# Patient Record
Sex: Male | Born: 1990 | Race: White | Hispanic: No | Marital: Single | Smoking: Current every day smoker
Health system: Southern US, Community
[De-identification: ages and names within clinical notes are randomized; demographics above are authoritative.]

---
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
## 2020-05-14 ENCOUNTER — Emergency Department (HOSPITAL_BASED_OUTPATIENT_CLINIC_OR_DEPARTMENT_OTHER)
Admission: EM | Admit: 2020-05-14 | Discharge: 2020-05-14 | Disposition: A | Discharge status: Home / Self Care | Payer: Self-pay | Attending: Emergency Medicine | Admitting: Emergency Medicine

## 2020-05-14 ENCOUNTER — Encounter (HOSPITAL_BASED_OUTPATIENT_CLINIC_OR_DEPARTMENT_OTHER): Payer: Self-pay

## 2020-05-14 ENCOUNTER — Other Ambulatory Visit: Payer: Self-pay

## 2020-05-14 DIAGNOSIS — R1013 Epigastric pain: Secondary | ICD-10-CM

## 2020-05-14 DIAGNOSIS — R1011 Right upper quadrant pain: Secondary | ICD-10-CM | POA: Insufficient documentation

## 2020-05-14 DIAGNOSIS — K920 Hematemesis: Secondary | ICD-10-CM | POA: Insufficient documentation

## 2020-05-14 DIAGNOSIS — F17210 Nicotine dependence, cigarettes, uncomplicated: Secondary | ICD-10-CM | POA: Insufficient documentation

## 2020-05-14 LAB — CBC
HCT: 48.2 % (ref 39.0–52.0)
Hemoglobin: 17.1 g/dL — ABNORMAL HIGH (ref 13.0–17.0)
MCH: 30.8 pg (ref 26.0–34.0)
MCHC: 35.5 g/dL (ref 30.0–36.0)
MCV: 86.7 fL (ref 80.0–100.0)
Platelets: 267 10*3/uL (ref 150–400)
RBC: 5.56 MIL/uL (ref 4.22–5.81)
RDW: 12.3 % (ref 11.5–15.5)
WBC: 15 10*3/uL — ABNORMAL HIGH (ref 4.0–10.5)
nRBC: 0 % (ref 0.0–0.2)

## 2020-05-14 LAB — COMPREHENSIVE METABOLIC PANEL
ALT: 42 U/L (ref 0–44)
AST: 33 U/L (ref 15–41)
Albumin: 4.7 g/dL (ref 3.5–5.0)
Alkaline Phosphatase: 78 U/L (ref 38–126)
Anion gap: 12 (ref 5–15)
BUN: 18 mg/dL (ref 6–20)
CO2: 24 mmol/L (ref 22–32)
Calcium: 10.1 mg/dL (ref 8.9–10.3)
Chloride: 103 mmol/L (ref 98–111)
Creatinine, Ser: 1.13 mg/dL (ref 0.61–1.24)
GFR calc Af Amer: >60 mL/min (ref >60)
GFR calc non Af Amer: >60 mL/min (ref >60)
Glucose, Bld: 120 mg/dL — ABNORMAL HIGH (ref 70–99)
Potassium: 4.4 mmol/L (ref 3.5–5.1)
Sodium: 139 mmol/L (ref 135–145)
Total Bilirubin: 1 mg/dL (ref 0.3–1.2)
Total Protein: 7.6 g/dL (ref 6.5–8.1)

## 2020-05-14 LAB — LIPASE, BLOOD: Lipase: 25 U/L (ref 11–51)

## 2020-05-14 MED ORDER — FAMOTIDINE IN NACL 20-0.9 MG/50ML-% IV SOLN
20.0000 mg | Freq: Once | INTRAVENOUS | Status: AC
  Administered 2020-05-14: 20 mg via INTRAVENOUS
  Filled 2020-05-14: qty 50

## 2020-05-14 MED ORDER — SODIUM CHLORIDE 0.9% FLUSH
3.0000 mL | Freq: Once | INTRAVENOUS | Status: DC
  Filled 2020-05-14: qty 3

## 2020-05-14 MED ORDER — SODIUM CHLORIDE 0.9 % IV BOLUS
1000.0000 mL | Freq: Once | INTRAVENOUS | Status: AC
  Administered 2020-05-14: 1000 mL via INTRAVENOUS

## 2020-05-14 MED ORDER — ONDANSETRON HCL 4 MG/2ML IJ SOLN
4.0000 mg | Freq: Once | INTRAMUSCULAR | Status: AC
  Administered 2020-05-14: 4 mg via INTRAVENOUS
  Filled 2020-05-14: qty 2

## 2020-05-14 MED ORDER — SODIUM CHLORIDE 0.9 % IV SOLN
INTRAVENOUS | Status: DC | PRN
  Administered 2020-05-14: 250 mL via INTRAVENOUS

## 2020-05-14 NOTE — ED Triage Notes (Signed)
Pt c/o epigastric pain and vomited "bright red blood" x 1-NAD-to triage in w/c

## 2020-05-14 NOTE — ED Provider Notes (Signed)
[facility] EMERGENCY DEPARTMENT Provider Note   CSN: [id number] Arrival date & time: 05/14/20  1449     History Chief Complaint  Patient presents with  . Abdominal Pain    Patrick Wiggins is a 29 y.o. male.  He started having epigastric pain yesterday.  It continued into today and he vomited multiple times for which he noticed some red blood in it.  Pain was initially 8 out of 10 now 2 out of 10 here.  No melena.  He has multiple risk factors for peptic ulcer disease including Goody powders, alcohol, tobacco.  No prior abdominal surgeries.  He said he had an endoscopy 5+ years ago for abdominal pain and found that he was lactose intolerant.  The history is provided by the patient.  Abdominal Pain Pain location:  Epigastric Pain quality: cramping and stabbing   Pain radiates to:  Does not radiate Pain severity:  Severe Onset quality:  Gradual Timing:  Constant Progression:  Improving Chronicity:  New Context: alcohol use   Context: not recent travel, not sick contacts and not trauma   Relieved by:  None tried Worsened by:  Nothing Ineffective treatments:  None tried Associated symptoms: hematemesis, nausea and vomiting   Associated symptoms: no chest pain, no cough, no diarrhea, no dysuria, no fever, no hematochezia, no hematuria, no shortness of breath and no sore throat   Risk factors: NSAID use        History reviewed. No pertinent past medical history.  There are no problems to display for this patient.   History reviewed. No pertinent surgical history.     No family history on file.  Social History   Tobacco Use  . Smoking status: Current Every Day Smoker    Types: Cigarettes  . Smokeless tobacco: Never Used  Substance Use Topics  . Alcohol use: Yes    Comment: daily 3-4 beers  . Drug use: Yes    Types: Marijuana, Cocaine    Home Medications Prior to Admission medications   Not on File    Allergies    Patient has no known allergies.  Review of Systems   Review of Systems  Constitutional: Negative for fever.  HENT: Negative for sore throat.   Eyes: Negative for visual disturbance.  Respiratory: Negative for cough and shortness of breath.   Cardiovascular: Negative for chest pain.  Gastrointestinal: Positive for abdominal pain, hematemesis, nausea and vomiting. Negative for diarrhea and hematochezia.  Genitourinary: Negative for dysuria and hematuria.  Musculoskeletal: Negative for neck pain.  Skin: Negative for rash.  Neurological: Negative for light-headedness.    Physical Exam Updated Vital Signs BP (!) 156/115 (BP Location: Right Arm)   Pulse 65   Temp 97.9 F (36.6 C) (Oral)   Resp 16   Ht 6' (1.829 m)   Wt 83.9 kg   SpO2 100%   BMI 25.09 kg/m   Physical Exam Vitals and nursing note reviewed.  Constitutional:      Appearance: He is well-developed.  HENT:     Head: Normocephalic and atraumatic.  Eyes:     Conjunctiva/sclera: Conjunctivae normal.  Cardiovascular:     Rate and Rhythm: Normal rate and regular rhythm.     Heart sounds: No murmur.  Pulmonary:     Effort: Pulmonary effort is normal. No respiratory distress.     Breath sounds: Normal breath sounds.  Abdominal:     Palpations: Abdomen is soft.     Tenderness: There is no abdominal tenderness. There is  no guarding or rebound.  Musculoskeletal:        General: No deformity or signs of injury. Normal range of motion.     Cervical back: Neck supple.  Skin:    General: Skin is warm and dry.     Capillary Refill: Capillary refill takes less than 2 seconds.  Neurological:     General: No focal deficit present.     Mental Status: He is alert.     ED Results / Procedures / Treatments   Labs (all labs ordered are listed, but only abnormal results are displayed) Labs Reviewed  COMPREHENSIVE METABOLIC PANEL - Abnormal; Notable for the following components:      Result Value   Glucose, Bld 120 (*)    All other components within normal  limits  CBC - Abnormal; Notable for the following components:   WBC 15.0 (*)    Hemoglobin 17.1 (*)    All other components within normal limits  LIPASE, BLOOD    EKG None  Radiology No results found.  Procedures Procedures (including critical care time)  Medications Ordered in ED Medications  sodium chloride 0.9 % bolus 1,000 mL ( Intravenous Stopped 05/14/20 1933)  ondansetron (ZOFRAN) injection 4 mg (4 mg Intravenous Given 05/14/20 1833)  famotidine (PEPCID) IVPB 20 mg premix ( Intravenous Stopped 05/14/20 1915)    ED Course  I have reviewed the triage vital signs and the nursing notes.  Pertinent labs & imaging results that were available during my care of the patient were reviewed by me and considered in my medical decision making (see chart for details).  Clinical Course as of May 15 906  Wed May 14, 2020  1942 Patient's pain is resolved.  He is urinated here so I think he is adequately hydrated.  His hemoglobin is not low so I am less concerned that he is an active GI bleed.  Not tachycardic.  Likely related to some peptic ulcer disease or possibly a Mallory-Weiss.  Will have follow-up outpatient GI.  He said he is going to do some over-the-counter Prilosec.  Return instructions discussed   [MB]    Clinical Course User Index [MB] Terrilee Files, MD   MDM Rules/Calculators/A&P                     This patient complains of upper abdominal pain vomiting with blood; this involves an extensive number of treatment Options and is a complaint that carries with it a high risk of complications and Morbidity. The differential includes gastritis, peptic ulcer disease, Mallory-Weiss tear, variceal bleed  I ordered, reviewed and interpreted labs, which included CBC with elevated white count, question stress related versus infection, normal hemoglobin, normal chemistries other than elevated glucose, likely related to stress.  Normal BUN and creatinine so less likely to be  significant GI bleed. I ordered medication Pepcid fluids Zofran with improvement in his symptoms Previous records obtained and reviewed in epic  After the interventions stated above, I reevaluated the patient and found patient symptoms to be improved.  He said he used to be on a PPI but stopped taking them.  He does not want a prescription but states he will go back to using over-the-counter Prilosec.  We discussed other risk factor modification.  Will refer to GI.  Return instructions discussed.   Final Clinical Impression(s) / ED Diagnoses Final diagnoses:  Epigastric pain  Hematemesis with nausea    Rx / DC Orders ED Discharge Orders  Ordered    Ambulatory referral to Gastroenterology     05/14/20 1945           Hayden Rasmussen, MD 05/15/20 [phone]

## 2020-05-14 NOTE — Discharge Instructions (Signed)
You were seen in the emergency department for upper abdominal pain nausea vomiting with blood in the vomit.  You had blood work that did not show any serious findings.  Please try to limit using Goody powders and other anti-inflammatories.  Limit smoking and alcohol.  Start taking Prilosec.  Follow-up with GI.  If you have any worsening bleeding or pain return to the emergency department for further evaluation

## 2020-05-14 NOTE — ED Notes (Signed)
Pt will press call bell when he has a urine sample for [place].

## 2020-05-22 ENCOUNTER — Encounter: Payer: Self-pay | Admitting: Internal Medicine

## 2021-02-17 ENCOUNTER — Encounter (HOSPITAL_BASED_OUTPATIENT_CLINIC_OR_DEPARTMENT_OTHER): Payer: Self-pay

## 2021-02-17 ENCOUNTER — Emergency Department (HOSPITAL_BASED_OUTPATIENT_CLINIC_OR_DEPARTMENT_OTHER): Payer: Self-pay

## 2021-02-17 ENCOUNTER — Other Ambulatory Visit (HOSPITAL_COMMUNITY): Payer: Self-pay | Admitting: Emergency Medicine

## 2021-02-17 ENCOUNTER — Other Ambulatory Visit: Payer: Self-pay

## 2021-02-17 ENCOUNTER — Emergency Department (HOSPITAL_BASED_OUTPATIENT_CLINIC_OR_DEPARTMENT_OTHER)
Admission: EM | Admit: 2021-02-17 | Discharge: 2021-02-17 | Disposition: A | Discharge status: Home / Self Care | Payer: Self-pay | Attending: Emergency Medicine | Admitting: Emergency Medicine

## 2021-02-17 DIAGNOSIS — R1031 Right lower quadrant pain: Secondary | ICD-10-CM

## 2021-02-17 DIAGNOSIS — F1290 Cannabis use, unspecified, uncomplicated: Secondary | ICD-10-CM | POA: Insufficient documentation

## 2021-02-17 DIAGNOSIS — R110 Nausea: Secondary | ICD-10-CM | POA: Insufficient documentation

## 2021-02-17 DIAGNOSIS — F17210 Nicotine dependence, cigarettes, uncomplicated: Secondary | ICD-10-CM | POA: Insufficient documentation

## 2021-02-17 LAB — CBC WITH DIFFERENTIAL/PLATELET
Abs Immature Granulocytes: 0.02 10*3/uL (ref 0.00–0.07)
Basophils Absolute: 0 10*3/uL (ref 0.0–0.1)
Basophils Relative: 1 %
Eosinophils Absolute: 0.1 10*3/uL (ref 0.0–0.5)
Eosinophils Relative: 1 %
HCT: 47.8 % (ref 39.0–52.0)
Hemoglobin: 16.9 g/dL (ref 13.0–17.0)
Immature Granulocytes: 0 %
Lymphocytes Relative: 25 %
Lymphs Abs: 1.6 10*3/uL (ref 0.7–4.0)
MCH: 31.8 pg (ref 26.0–34.0)
MCHC: 35.4 g/dL (ref 30.0–36.0)
MCV: 90 fL (ref 80.0–100.0)
Monocytes Absolute: 0.5 10*3/uL (ref 0.1–1.0)
Monocytes Relative: 7 %
Neutro Abs: 4.2 10*3/uL (ref 1.7–7.7)
Neutrophils Relative %: 66 %
Platelets: 272 10*3/uL (ref 150–400)
RBC: 5.31 MIL/uL (ref 4.22–5.81)
RDW: 13 % (ref 11.5–15.5)
WBC: 6.4 10*3/uL (ref 4.0–10.5)
nRBC: 0 % (ref 0.0–0.2)

## 2021-02-17 LAB — URINALYSIS, ROUTINE W REFLEX MICROSCOPIC
Glucose, UA: NEGATIVE mg/dL
Ketones, ur: NEGATIVE mg/dL
Leukocytes,Ua: NEGATIVE
Nitrite: NEGATIVE
Protein, ur: NEGATIVE mg/dL
Specific Gravity, Urine: 1.01 (ref 1.005–1.030)
pH: 6 (ref 5.0–8.0)

## 2021-02-17 LAB — URINALYSIS, MICROSCOPIC (REFLEX)

## 2021-02-17 LAB — COMPREHENSIVE METABOLIC PANEL
ALT: 54 U/L — ABNORMAL HIGH (ref 0–44)
AST: 55 U/L — ABNORMAL HIGH (ref 15–41)
Albumin: 4.2 g/dL (ref 3.5–5.0)
Alkaline Phosphatase: 80 U/L (ref 38–126)
Anion gap: 11 (ref 5–15)
BUN: 5 mg/dL — ABNORMAL LOW (ref 6–20)
CO2: 23 mmol/L (ref 22–32)
Calcium: 9.1 mg/dL (ref 8.9–10.3)
Chloride: 103 mmol/L (ref 98–111)
Creatinine, Ser: 0.86 mg/dL (ref 0.61–1.24)
GFR, Estimated: >60 mL/min (ref >60)
Glucose, Bld: 103 mg/dL — ABNORMAL HIGH (ref 70–99)
Potassium: 3.9 mmol/L (ref 3.5–5.1)
Sodium: 137 mmol/L (ref 135–145)
Total Bilirubin: 0.7 mg/dL (ref 0.3–1.2)
Total Protein: 7.1 g/dL (ref 6.5–8.1)

## 2021-02-17 LAB — RAPID URINE DRUG SCREEN, HOSP PERFORMED
Amphetamines: NOT DETECTED
Barbiturates: NOT DETECTED
Benzodiazepines: NOT DETECTED
Cocaine: NOT DETECTED
Opiates: NOT DETECTED
Tetrahydrocannabinol: POSITIVE — AB

## 2021-02-17 LAB — LIPASE, BLOOD: Lipase: 43 U/L (ref 11–51)

## 2021-02-17 MED ORDER — ONDANSETRON 4 MG PO TBDP: 4.0000 mg | ORAL_TABLET | Freq: Three times a day (TID) | ORAL | 0 refills | Status: AC | PRN

## 2021-02-17 MED ORDER — KETOROLAC TROMETHAMINE 30 MG/ML IJ SOLN
30.0000 mg | Freq: Once | INTRAMUSCULAR | Status: AC
  Administered 2021-02-17: 30 mg via INTRAVENOUS
  Filled 2021-02-17: qty 1

## 2021-02-17 MED ORDER — IOHEXOL 300 MG/ML  SOLN
100.0000 mL | Freq: Once | INTRAMUSCULAR | Status: AC | PRN
  Administered 2021-02-17: 100 mL via INTRAVENOUS

## 2021-02-17 MED ORDER — PANTOPRAZOLE SODIUM 40 MG PO TBEC: 40.0000 mg | DELAYED_RELEASE_TABLET | Freq: Every day | ORAL | 0 refills | Status: AC

## 2021-02-17 MED ORDER — DICYCLOMINE HCL 20 MG PO TABS
20.0000 mg | ORAL_TABLET | Freq: Three times a day (TID) | ORAL | 0 refills | Status: AC | PRN
Start: 2021-02-17 — End: ?

## 2021-02-17 MED ORDER — ONDANSETRON HCL 4 MG/2ML IJ SOLN
4.0000 mg | Freq: Once | INTRAMUSCULAR | Status: AC
  Administered 2021-02-17: 4 mg via INTRAVENOUS
  Filled 2021-02-17: qty 2

## 2021-02-17 MED FILL — DICYCLOMINE 20 MG TABLET: 20 | 7 days supply | Qty: 20 | Fill #0

## 2021-02-17 MED FILL — ONDANSETRON ODT 4 MG TABLET: 4 | 6 days supply | Qty: 20 | Fill #0

## 2021-02-17 MED FILL — PANTOPRAZOLE SOD DR 40 MG T: 40 | 30 days supply | Qty: 30 | Fill #0

## 2021-02-17 NOTE — ED Provider Notes (Signed)
Emergency Department Provider Note   I have reviewed the triage vital signs and the nursing notes.   HISTORY  Chief Complaint Abdominal Pain   HPI Patrick Wiggins is a 30 y.o. male with past medical history of chronic abdominal pain as a child presents to the emergency department with return of right lower quadrant abdominal pain.  Patient states that for approximately 11 years as a child he had intermittent, severe right lower quadrant abdominal pain.  He states that as an adult he stopped drinking milk and the pain went away.  He has not had pain in this area since 2015 after undergoing the dietary change.  He notes pain returning in a severe way over the past 24 to 36 hours.  He has some nausea but no vomiting or diarrhea.  No dysuria, hesitancy, urgency.  No radiation of pain up into the chest, shortness of breath, cough. He continues to abstain from dairy.    History reviewed. No pertinent past medical history.  There are no problems to display for this patient.   History reviewed. No pertinent surgical history.  Allergies Patient has no known allergies.  No family history on file.  Social History Social History   Tobacco Use  . Smoking status: Current Every Day Smoker    Packs/day: 1.00    Types: Cigarettes  . Smokeless tobacco: Never Used  Vaping Use  . Vaping Use: Never used  Substance Use Topics  . Alcohol use: Yes    Comment: daily 3-4 beers  . Drug use: Yes    Types: Marijuana, Cocaine    Comment: mushrooms     Review of Systems  Constitutional: No fever/chills Eyes: No visual changes. ENT: No sore throat. Cardiovascular: Denies chest pain. Respiratory: Denies shortness of breath. Gastrointestinal: Positive RLQ abdominal pain. Positive nausea, no vomiting.  No diarrhea.  No constipation. Genitourinary: Negative for dysuria. Musculoskeletal: Negative for back pain. Skin: Negative for rash. Neurological: Negative for focal weakness or numbness.  Positive HA.   10-point ROS otherwise negative.  ____________________________________________   PHYSICAL EXAM:  VITAL SIGNS: ED Triage Vitals  Enc Vitals Group     BP 02/17/21 0929 (!) 145/99     Pulse Rate 02/17/21 0929 80     Resp 02/17/21 0929 18     Temp 02/17/21 0929 98 F (36.7 C)     Temp Source 02/17/21 0929 Oral     SpO2 02/17/21 0929 98 %     Weight 02/17/21 0930 185 lb (83.9 kg)     Height 02/17/21 0930 6' (1.829 m)   Constitutional: Alert and oriented. Well appearing and in no acute distress. Eyes: Conjunctivae are normal.  Head: Atraumatic. Nose: No congestion/rhinnorhea. Mouth/Throat: Mucous membranes are moist.   Neck: No stridor.   Cardiovascular: Normal rate, regular rhythm. Good peripheral circulation. Grossly normal heart sounds.   Respiratory: Normal respiratory effort.  No retractions. Lungs CTAB. Gastrointestinal: Soft with mild tenderness to deep palpation in the RLQ. No rebound or guarding. No distention.  Musculoskeletal: No gross deformities of extremities. Neurologic:  Normal speech and language.  Skin:  Skin is warm, dry and intact. No rash noted.   ____________________________________________   LABS (all labs ordered are listed, but only abnormal results are displayed)  Labs Reviewed  COMPREHENSIVE METABOLIC PANEL - Abnormal; Notable for the following components:      Result Value   Glucose, Bld 103 (*)    BUN 5 (*)    AST 55 (*)  ALT 54 (*)    All other components within normal limits  URINALYSIS, ROUTINE W REFLEX MICROSCOPIC - Abnormal; Notable for the following components:   Hgb urine dipstick TRACE (*)    Bilirubin Urine MODERATE (*)    All other components within normal limits  RAPID URINE DRUG SCREEN, HOSP PERFORMED - Abnormal; Notable for the following components:   Tetrahydrocannabinol POSITIVE (*)    All other components within normal limits  URINALYSIS, MICROSCOPIC (REFLEX) - Abnormal; Notable for the following  components:   Bacteria, UA RARE (*)    All other components within normal limits  LIPASE, BLOOD  CBC WITH DIFFERENTIAL/PLATELET    ____________________________________________  RADIOLOGY  CT ABDOMEN PELVIS W CONTRAST  Result Date: 02/17/2021 CLINICAL DATA:  Right lower quadrant pain EXAM: CT ABDOMEN AND PELVIS WITH CONTRAST TECHNIQUE: Multidetector CT imaging of the abdomen and pelvis was performed using the standard protocol following bolus administration of intravenous contrast. CONTRAST:  OMNIPAQUE IOHEXOL 300 MG/ML  SOLN COMPARISON:  None. FINDINGS: Lower chest: No acute abnormality. Hepatobiliary: No focal liver abnormality is seen. No gallstones, gallbladder wall thickening, or biliary dilatation. Pancreas: Unremarkable. Spleen: Unremarkable. Adrenals/Urinary Tract: Adrenals, kidneys, and poorly distended bladder are unremarkable. Stomach/Bowel: Stomach is within normal limits. Bowel is normal in caliber. Normal appendix. Mild diffuse colonic wall thickening. Terminal ileum is poorly distended. Vascular/Lymphatic: No significant vascular abnormality. No enlarged lymph nodes. Reproductive: Unremarkable. Other: No ascites.  Abdominal wall is unremarkable. Musculoskeletal: No acute osseous abnormality. IMPRESSION: Normal appendix. Mild diffuse colonic wall thickening, which may be on the basis of underdistention. No surrounding inflammatory changes to suggest acute process. Correlate with colonoscopy findings. Electronically Signed   By: Guadlupe Spanish M.D.   On: 02/17/2021 10:31    ____________________________________________   PROCEDURES  Procedure(s) performed:   Procedures  None  ____________________________________________   INITIAL IMPRESSION / ASSESSMENT AND PLAN / ED COURSE  Pertinent labs & imaging results that were available during my care of the patient were reviewed by me and considered in my medical decision making (see chart for details).   Patient presents  emergency department with return of right lower quadrant abdominal pain.  This is a chronic pain for him as a child that is resolved many years ago and has just recently returned.  He has mild tenderness in the right lower quadrant but no rebound or guarding.  He has no prior abdominal surgery history.  This may be his chronic pain returning but after many years of not having pain I do feel that work-up is indicated.  Plan for labs, UA, CT abdomen pelvis.  In review of epic I do not see any recent CT scans.  Patient states that his last one was he thinks around 83 or maybe before.   CT abdomen pelvis reviewed with no acute findings.  Appendix is normal.  Mild inflammation through the colon noted.  Patient reports a history of colonoscopy but nothing recent.  Will provide contact info for PCP as well as GI for continued follow-up.  Plan for symptom management at home.  Discussed ED return precautions. ____________________________________________  FINAL CLINICAL IMPRESSION(S) / ED DIAGNOSES  Final diagnoses:  Right lower quadrant abdominal pain     MEDICATIONS GIVEN DURING THIS VISIT:  Medications  ketorolac (TORADOL) 30 MG/ML injection 30 mg (30 mg Intravenous Given 02/17/21 0958)  ondansetron (ZOFRAN) injection 4 mg (4 mg Intravenous Given 02/17/21 0957)  iohexol (OMNIPAQUE) 300 MG/ML solution 100 mL (100 mLs Intravenous Contrast Given 02/17/21 1009)  NEW OUTPATIENT MEDICATIONS STARTED DURING THIS VISIT:  New Prescriptions   DICYCLOMINE (BENTYL) 20 MG TABLET    Take 1 tablet (20 mg total) by mouth 3 (three) times daily as needed.   ONDANSETRON (ZOFRAN ODT) 4 MG DISINTEGRATING TABLET    Take 1 tablet (4 mg total) by mouth every 8 (eight) hours as needed.   PANTOPRAZOLE (PROTONIX) 40 MG TABLET    Take 1 tablet (40 mg total) by mouth daily.    Note:  This document was prepared using Dragon voice recognition software and may include unintentional dictation errors.  Alona Bene, MD,  [facility]    Tanisa Lagace, Arlyss Repress, MD 02/17/21 [phone]

## 2021-02-17 NOTE — Discharge Instructions (Signed)

## 2021-02-17 NOTE — ED Triage Notes (Signed)
Pt reports chronic pain to RLQ, has had EGD's and colonoscopies. Reports he stopped drinking milk and it got better but over the past few days the pain has come back, denies drinking any milk or other lactose. Pt reports pain is worse when standing and worse in the morning. Pt reports some nausea from pain, no vomiting or diarrhea.

## 2021-07-20 ENCOUNTER — Other Ambulatory Visit (HOSPITAL_BASED_OUTPATIENT_CLINIC_OR_DEPARTMENT_OTHER): Payer: Self-pay

## 2021-07-20 ENCOUNTER — Emergency Department (HOSPITAL_COMMUNITY)
Admission: EM | Admit: 2021-07-20 | Discharge: 2021-07-20 | Disposition: A | Discharge status: Home / Self Care | Payer: Self-pay | Attending: Emergency Medicine | Admitting: Emergency Medicine

## 2021-07-20 ENCOUNTER — Other Ambulatory Visit: Payer: Self-pay

## 2021-07-20 ENCOUNTER — Encounter (HOSPITAL_COMMUNITY): Payer: Self-pay | Admitting: Emergency Medicine

## 2021-07-20 ENCOUNTER — Emergency Department (HOSPITAL_COMMUNITY): Payer: Self-pay

## 2021-07-20 DIAGNOSIS — M25561 Pain in right knee: Secondary | ICD-10-CM | POA: Insufficient documentation

## 2021-07-20 DIAGNOSIS — F17210 Nicotine dependence, cigarettes, uncomplicated: Secondary | ICD-10-CM | POA: Insufficient documentation

## 2021-07-20 MED ORDER — DOXYCYCLINE HYCLATE 100 MG PO CAPS
100.0000 mg | ORAL_CAPSULE | Freq: Two times a day (BID) | ORAL | 0 refills | Status: AC
  Filled 2021-07-20: qty 14, 7d supply, fill #0

## 2021-07-20 MED ORDER — KETOROLAC TROMETHAMINE 15 MG/ML IJ SOLN
15.0000 mg | Freq: Once | INTRAMUSCULAR | Status: AC
  Administered 2021-07-20: 15 mg via INTRAMUSCULAR
  Filled 2021-07-20: qty 1

## 2021-07-20 MED ORDER — NAPROXEN 500 MG PO TABS
500.0000 mg | ORAL_TABLET | Freq: Two times a day (BID) | ORAL | 0 refills | Status: AC
Start: 2021-07-20 — End: 2021-07-27
  Filled 2021-07-20: qty 14, 7d supply, fill #0

## 2021-07-20 NOTE — ED Provider Notes (Signed)
[facility]-EMERGENCY DEPT Provider Note   CSN: [id number] Arrival date & time: 07/20/21  1137     History Chief Complaint  Patient presents with   Knee Pain    Patrick Wiggins is a 30 y.o. male.  30 y.o male with no PMH presents to the ED with a chief complaint of right knee pain x today. Patient reports waking up this morning, feeling like "somebody took a sledgehammer to my right knee ".  Reports he been icing the area but without much improvement in symptoms.  In addition, patient reports he "popped an ingrown hair ", on his right knee about 2 days ago, reports cleaning the area with alcohol, pain began two days after.  No fever, no history of IV drug use, no trauma.   The history is provided by the patient.  Knee Pain Location:  Knee Time since incident:  1 day Injury: no   Knee location:  R knee Pain details:    Quality:  Sharp and throbbing   Radiates to:  Does not radiate   Severity:  Moderate   Onset quality:  Sudden   Duration:  1 day   Timing:  Constant   Progression:  Worsening Chronicity:  New Dislocation: no   Foreign body present:  No foreign bodies Tetanus status:  Unknown Relieved by:  Ice Worsened by:  Extension Associated symptoms: no fever         Social History   Tobacco Use   Smoking status: Every Day    Packs/day: 1.00    Types: Cigarettes   Smokeless tobacco: Never  Vaping Use   Vaping Use: Never used  Substance Use Topics   Alcohol use: Yes    Comment: daily 3-4 beers   Drug use: Yes    Types: Marijuana, Cocaine    Comment: mushrooms     Home Medications Prior to Admission medications   Medication Sig Start Date End Date Taking? Authorizing Provider  doxycycline (VIBRAMYCIN) 100 MG capsule Take 1 capsule (100 mg total) by mouth 2 (two) times daily for 7 days. 07/20/21 07/27/21 Yes Jerome Viglione, PA-C  naproxen (NAPROSYN) 500 MG tablet Take 1 tablet (500 mg total) by mouth 2 (two) times daily for 7 days. 07/20/21  07/27/21 Yes Sheera Illingworth, Leonie Douglas, PA-C  dicyclomine (BENTYL) 20 MG tablet Take 1 tablet (20 mg total) by mouth 3 (three) times daily as needed. 02/17/21   Long, Arlyss Repress, MD  dicyclomine (BENTYL) 20 MG tablet TAKE 1 TABLET (20 MG TOTAL) BY MOUTH 3 (THREE) TIMES DAILY AS NEEDED. 02/17/21 02/17/22  Long, Arlyss Repress, MD  ondansetron (ZOFRAN ODT) 4 MG disintegrating tablet Take 1 tablet (4 mg total) by mouth every 8 (eight) hours as needed. 02/17/21   Long, Arlyss Repress, MD  ondansetron (ZOFRAN-ODT) 4 MG disintegrating tablet TAKE 1 TABLET (4 MG TOTAL) BY MOUTH EVERY 8 (EIGHT) HOURS AS NEEDED. 02/17/21 02/17/22  Long, Arlyss Repress, MD  pantoprazole (PROTONIX) 40 MG tablet Take 1 tablet (40 mg total) by mouth daily. 02/17/21   Long, Arlyss Repress, MD  pantoprazole (PROTONIX) 40 MG tablet TAKE 1 TABLET (40 MG TOTAL) BY MOUTH DAILY. 02/17/21 02/17/22  Long, Arlyss Repress, MD    Allergies    Patient has no known allergies.  Review of Systems   Review of Systems  Constitutional:  Negative for chills and fever.  Musculoskeletal:  Positive for arthralgias.   Physical Exam Updated Vital Signs BP (!) 145/95   Pulse (!) 102   Temp  98.6 F (37 C) (Oral)   Resp 16   Ht 6' (1.829 m)   Wt 88.5 kg   SpO2 98%   BMI 26.45 kg/m   Physical Exam Vitals and nursing note reviewed.  Constitutional:      Appearance: Normal appearance.  HENT:     Head: Normocephalic and atraumatic.  Eyes:     Pupils: Pupils are equal, round, and reactive to light.  Cardiovascular:     Rate and Rhythm: Normal rate.  Pulmonary:     Effort: Pulmonary effort is normal.  Abdominal:     General: Abdomen is flat.  Musculoskeletal:        General: Swelling and tenderness present.     Cervical back: Normal range of motion and neck supple.     Right knee: Swelling, effusion and bony tenderness present. Normal range of motion. Tenderness present.       Legs:     Comments: Swelling noted to the right knee, with surrounding erythema, some streaking in the skin.   Full range of motion of the right knee with pain.  Skin:    General: Skin is warm and dry.  Neurological:     Mental Status: He is alert and oriented to person, place, and time.    ED Results / Procedures / Treatments   Labs (all labs ordered are listed, but only abnormal results are displayed) Labs Reviewed - No data to display  EKG None  Radiology DG Knee 2 Views Right  Result Date: 07/20/2021 CLINICAL DATA:  Pain EXAM: RIGHT KNEE - 1-2 VIEW COMPARISON:  None. FINDINGS: No evidence of acute fracture. No joint effusion. No significant degenerative change. There is prepatellar soft tissue swelling. IMPRESSION: Prepatellar soft tissue swelling, possibly bursitis. No acute fracture or significant joint effusion. Electronically Signed   By: Caprice Renshaw   On: 07/20/2021 14:09    Procedures Procedures   Medications Ordered in ED Medications  ketorolac (TORADOL) 15 MG/ML injection 15 mg (has no administration in time range)    ED Course  I have reviewed the triage vital signs and the nursing notes.  Pertinent labs & imaging results that were available during my care of the patient were reviewed by me and considered in my medical decision making (see chart for details).    MDM Rules/Calculators/A&P  Patient arrives with right knee pain.  No trauma, no IV drug use, no fever, no chills.  No prior history of gout, does endorse beer intake.  Did pop a ingrown hair to his right knee 2 days ago, right knee appears erythematous, with a mild effusion noted but has full range of motion of the knee joint.  Has placed ice along with taking a Goody powder with some relief in his symptoms.  Xray of the right knee showed:  Prepatellar soft tissue swelling, possibly bursitis.     No acute fracture or significant joint effusion.      These results were discussed at length with patient, I personally marked his right knee with a skin marker, he was advised if redness worsens he will need to  return to the emergency department sooner than the 2 days.  Some suspicion for septic bursitis, no involvement in the joint as he is able to range his joint fully without any pain.  Does describe some chills but was afebrile in evaluation.  Will be placed on doxycycline a 7-day course, also given anti-inflammatories to help with pain.  Return precautions discussed at length, patient stable  for discharge.   Portions of this note were generated with Scientist, clinical (histocompatibility and immunogenetics). Dictation errors may occur despite best attempts at proofreading.  Final Clinical Impression(s) / ED Diagnoses Final diagnoses:  Acute pain of right knee    Rx / DC Orders ED Discharge Orders          Ordered    doxycycline (VIBRAMYCIN) 100 MG capsule  2 times daily        07/20/21 1447    naproxen (NAPROSYN) 500 MG tablet  2 times daily        07/20/21 1448             Claude Manges, PA-C 07/20/21 1452    Derwood Kaplan, MD 07/20/21 1528

## 2021-07-20 NOTE — Discharge Instructions (Addendum)
I have prescribed medication to help with the swelling, please take 1 tablet twice a day with food for the next 7 days.  In addition, you were placed on doxycycline, please take 1 tablet twice a day for the next 7 days as well.  We discussed worsening symptoms such as unable to move your right knee, worsening redness, fever or chills you will need to return to the emergency department immediately.  You will also need to return for a wound check in the next 2 days.

## 2021-07-20 NOTE — ED Triage Notes (Addendum)
Yesterday while pressing he gas pedal, the patient noticed soreness in the right knee. Pain worsened in the AM today and swelling was noted. Pain increases with activity and he is unable to bare weight on that knee.

## 2021-07-21 ENCOUNTER — Emergency Department (HOSPITAL_BASED_OUTPATIENT_CLINIC_OR_DEPARTMENT_OTHER)
Admission: EM | Admit: 2021-07-21 | Discharge: 2021-07-21 | Disposition: A | Discharge status: Home / Self Care | Payer: Self-pay | Attending: Emergency Medicine | Admitting: Emergency Medicine

## 2021-07-21 ENCOUNTER — Other Ambulatory Visit: Payer: Self-pay

## 2021-07-21 ENCOUNTER — Encounter (HOSPITAL_BASED_OUTPATIENT_CLINIC_OR_DEPARTMENT_OTHER): Payer: Self-pay | Admitting: *Deleted

## 2021-07-21 DIAGNOSIS — L03115 Cellulitis of right lower limb: Secondary | ICD-10-CM | POA: Insufficient documentation

## 2021-07-21 DIAGNOSIS — F17210 Nicotine dependence, cigarettes, uncomplicated: Secondary | ICD-10-CM | POA: Insufficient documentation

## 2021-07-21 DIAGNOSIS — J069 Acute upper respiratory infection, unspecified: Secondary | ICD-10-CM | POA: Insufficient documentation

## 2021-07-21 DIAGNOSIS — Z20822 Contact with and (suspected) exposure to covid-19: Secondary | ICD-10-CM | POA: Insufficient documentation

## 2021-07-21 DIAGNOSIS — B9789 Other viral agents as the cause of diseases classified elsewhere: Secondary | ICD-10-CM

## 2021-07-21 DIAGNOSIS — J988 Other specified respiratory disorders: Secondary | ICD-10-CM

## 2021-07-21 NOTE — ED Triage Notes (Signed)
Right knee swelling and redness. He was seen at [facility] yesterday for same and started on antibiotics for possible infection in his bursa.

## 2021-07-21 NOTE — ED Provider Notes (Signed)
[facility] EMERGENCY DEPARTMENT Provider Note   CSN: [id number] Arrival date & time: 07/21/21  1949     History Chief Complaint  Patient presents with   Cellulitis    Patrick Wiggins is a 30 y.o. male.  Patrick Wiggins is a 30 yo male who presents today for a follow-up on right knee bursitis, evaluated yesterday at [facility]. He states that on Sunday night he woke up with a horrible, 10/10 pain in his left knee. He said he could not walk and called out of work. He denies any recent fall, but does admit to recently popping a whitehead in the area with his fingernails which caused a small wound. He was evaluated at [facility] and imaging showed bursitis. He was discharged with naproxen and doxycycline, and has had two doses since. He stated that he can walk on it now and the pain has improved, but the area of erythema has expanded, and he was instructed to return for evaluation for this reason. In addition, he states he has been feeling "icky" today, and admits to inner ear pain on bilateral ears as well as a sore throat. He denies fever and chills.       History reviewed. No pertinent past medical history.  There are no problems to display for this patient.   History reviewed. No pertinent surgical history.     No family history on file.  Social History   Tobacco Use   Smoking status: Every Day    Packs/day: 1.00    Types: Cigarettes   Smokeless tobacco: Never  Vaping Use   Vaping Use: Some days  Substance Use Topics   Alcohol use: Yes    Comment: daily 3-4 beers   Drug use: Yes    Types: Marijuana, Cocaine    Comment: mushrooms     Home Medications Prior to Admission medications   Medication Sig Start Date End Date Taking? Authorizing Provider  dicyclomine (BENTYL) 20 MG tablet Take 1 tablet (20 mg total) by mouth 3 (three) times daily as needed. 02/17/21   Long, Arlyss Repress, MD  dicyclomine (BENTYL) 20 MG tablet TAKE 1 TABLET (20 MG TOTAL) BY MOUTH 3 (THREE) TIMES DAILY  AS NEEDED. 02/17/21 02/17/22  Long, Arlyss Repress, MD  doxycycline (VIBRAMYCIN) 100 MG capsule Take 1 capsule (100 mg total) by mouth 2 (two) times daily for 7 days. 07/20/21 07/27/21  Claude Manges, PA-C  naproxen (NAPROSYN) 500 MG tablet Take 1 tablet (500 mg total) by mouth 2 (two) times daily for 7 days. 07/20/21 07/27/21  Claude Manges, PA-C  ondansetron (ZOFRAN ODT) 4 MG disintegrating tablet Take 1 tablet (4 mg total) by mouth every 8 (eight) hours as needed. 02/17/21   Long, Arlyss Repress, MD  ondansetron (ZOFRAN-ODT) 4 MG disintegrating tablet TAKE 1 TABLET (4 MG TOTAL) BY MOUTH EVERY 8 (EIGHT) HOURS AS NEEDED. 02/17/21 02/17/22  Long, Arlyss Repress, MD  pantoprazole (PROTONIX) 40 MG tablet Take 1 tablet (40 mg total) by mouth daily. 02/17/21   Long, Arlyss Repress, MD  pantoprazole (PROTONIX) 40 MG tablet TAKE 1 TABLET (40 MG TOTAL) BY MOUTH DAILY. 02/17/21 02/17/22  Long, Arlyss Repress, MD    Allergies    Patient has no known allergies.  Review of Systems   Review of Systems  Constitutional:  Negative for fever.  HENT:  Positive for ear pain and sore throat.   Respiratory:  Negative for cough.   Musculoskeletal:  Positive for joint swelling. Negative for arthralgias.  Skin:  Positive  for color change and wound.  Allergic/Immunologic: Negative for immunocompromised state.  Neurological:  Negative for weakness and numbness.  Hematological:  Negative for adenopathy.  All other systems reviewed and are negative.  Physical Exam Updated Vital Signs BP (!) 142/90   Pulse 90   Temp 98.3 F (36.8 C) (Oral)   Resp 20   Ht 6' (1.829 m)   Wt 88.5 kg   SpO2 99%   BMI 26.46 kg/m   Physical Exam Vitals and nursing note reviewed.  Constitutional:      General: He is not in acute distress.    Appearance: He is well-developed. He is not diaphoretic.  HENT:     Head: Normocephalic and atraumatic.     Right Ear: Tympanic membrane and ear canal normal.     Left Ear: Tympanic membrane and ear canal normal.      Mouth/Throat:     Mouth: Mucous membranes are moist.     Pharynx: Posterior oropharyngeal erythema present. No oropharyngeal exudate.  Cardiovascular:     Rate and Rhythm: Normal rate and regular rhythm.     Pulses: Normal pulses.     Heart sounds: Normal heart sounds.  Pulmonary:     Effort: Pulmonary effort is normal.     Breath sounds: Normal breath sounds.  Musculoskeletal:        General: Swelling present. No tenderness or deformity.     Cervical back: Neck supple.     Comments: Able to fully flex and extend the right knee without pain.  There is erythema to the anterior right knee, no significant tenderness.  Lymphadenopathy:     Cervical: No cervical adenopathy.  Skin:    General: Skin is warm and dry.     Findings: Erythema present.  Neurological:     Mental Status: He is alert and oriented to person, place, and time.  Psychiatric:        Behavior: Behavior normal.       ED Results / Procedures / Treatments   Labs (all labs ordered are listed, but only abnormal results are displayed) Labs Reviewed  SARS CORONAVIRUS 2 (TAT 6-24 HRS)    EKG None  Radiology DG Knee 2 Views Right  Result Date: 07/20/2021 CLINICAL DATA:  Pain EXAM: RIGHT KNEE - 1-2 VIEW COMPARISON:  None. FINDINGS: No evidence of acute fracture. No joint effusion. No significant degenerative change. There is prepatellar soft tissue swelling. IMPRESSION: Prepatellar soft tissue swelling, possibly bursitis. No acute fracture or significant joint effusion. Electronically Signed   By: Caprice Renshaw   On: 07/20/2021 14:09    Procedures Procedures   Medications Ordered in ED Medications - No data to display  ED Course  I have reviewed the triage vital signs and the nursing notes.  Pertinent labs & imaging results that were available during my care of the patient were reviewed by me and considered in my medical decision making (see chart for details).  Clinical Course as of 07/21/21 2334  Tue Jul 21, 4064  674 30 year old male with spreading of redness to the right knee.  Patient seen yesterday for same, started on doxycycline, has taken 2 doses.  Exam is reassuring, he is able to fully flex and extend the knee, able to bear weight without pain and feels as if the knee is improving overall despite increase in redness as marked and photographed for chart.  As patient is only taken 2 doses of his doxycycline, recommend he continue to take the antibiotics  and monitor the area.  In regards to his mild URI symptoms, we will send COVID test today.  He feels like this is likely not COVID as he has had COVID 3 times previously with the last infection being October 2021.  He will follow-up in his MyChart account for his test results. [LM]    Clinical Course User Index [LM] Alden Hipp   MDM Rules/Calculators/A&P                           Final Clinical Impression(s) / ED Diagnoses Final diagnoses:  Viral respiratory illness  Cellulitis of right knee    Rx / DC Orders ED Discharge Orders     None        Jeannie Fend, PA-C 07/21/21 2335    Maia Plan, MD 07/27/21 1303

## 2021-07-21 NOTE — Discharge Instructions (Addendum)
Home to quarantine pending your COVID test results which will be available in your MyChart account in the next 6 to 24 hours. Apply warm compresses to your knee for 20 minutes at a time.  Continue taking your antibiotics.

## 2021-07-22 LAB — SARS CORONAVIRUS 2 (TAT 6-24 HRS): SARS Coronavirus 2: NEGATIVE

## 2022-07-30 IMAGING — CT CT ABD-PELV W/ CM
2 of 4 series · 16 of 46 positions shown, 18 images · IV contrast (Omnipaque)
Comparison: None.

CLINICAL DATA: Right lower quadrant pain

EXAM:
CT ABDOMEN AND PELVIS WITH CONTRAST
TECHNIQUE: Multidetector CT imaging of the abdomen and pelvis was performed
using the standard protocol following bolus administration of
intravenous contrast.
CONTRAST:  100mL OMNIPAQUE IOHEXOL 300 MG/ML  SOLN

[Series 2: axial st · axial · 0.85mm/px · z∈[-696,-152]mm · 13 of 119 slices shown, 15 images]
[im 5/119  soft-tissue]
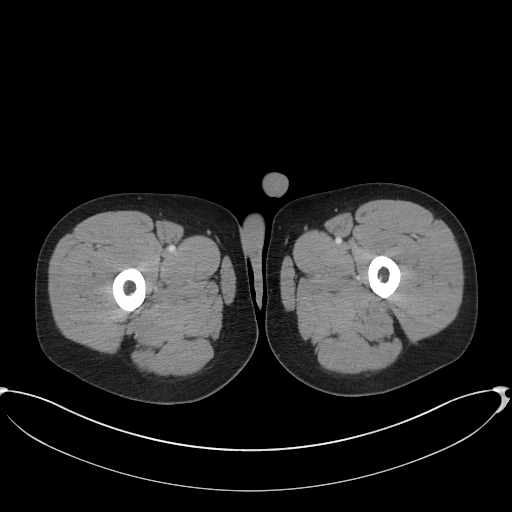
[im 5/119  bone]
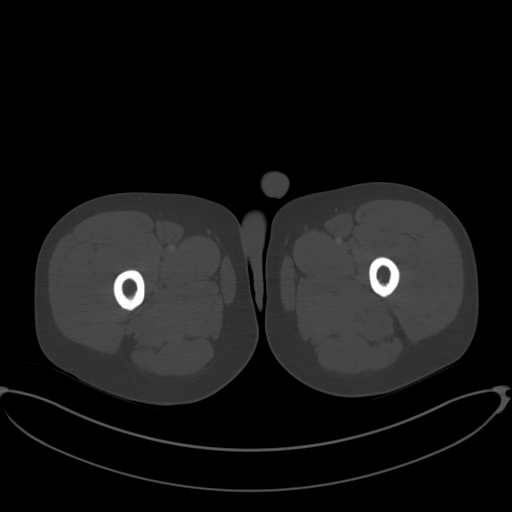
[im 15/119  soft-tissue]
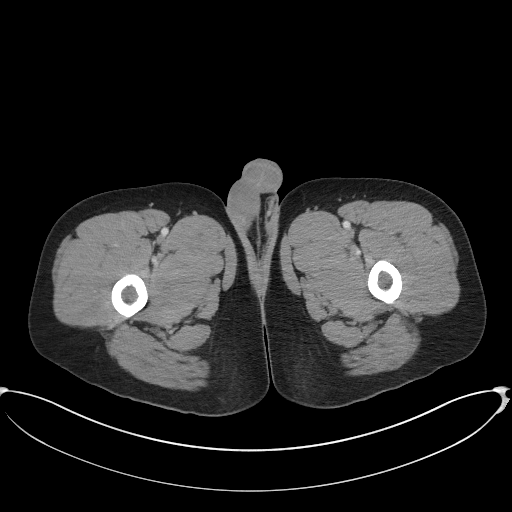
[im 25/119  soft-tissue]
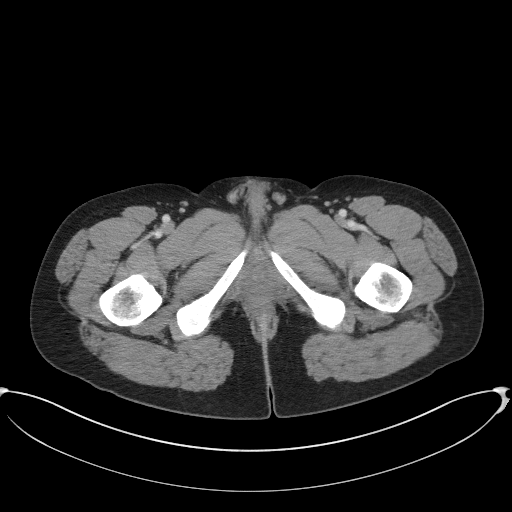
[im 35/119  soft-tissue]
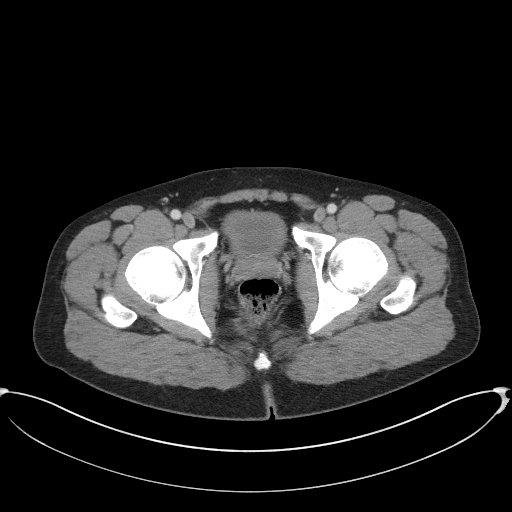
[im 40/119  soft-tissue]
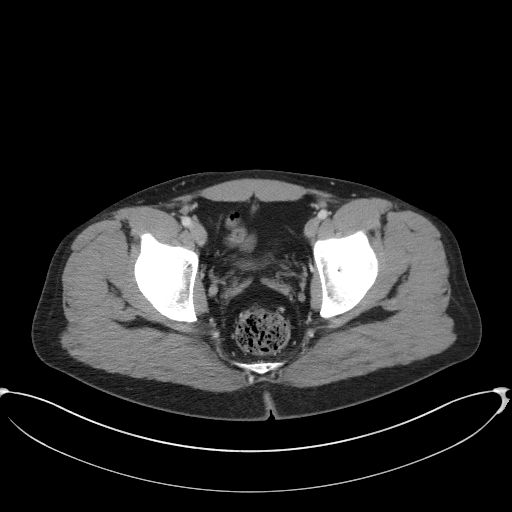
[im 50/119  soft-tissue]
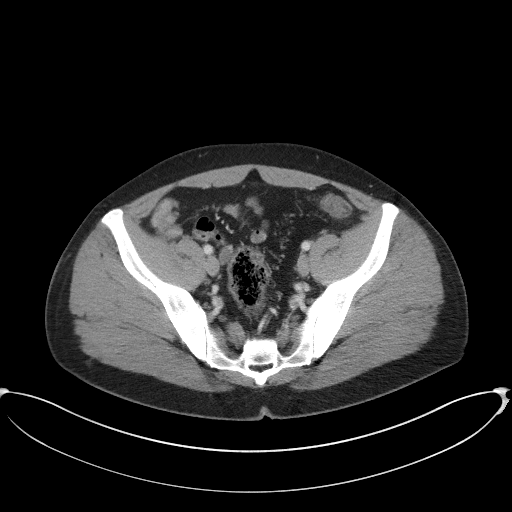
[im 60/119  soft-tissue]
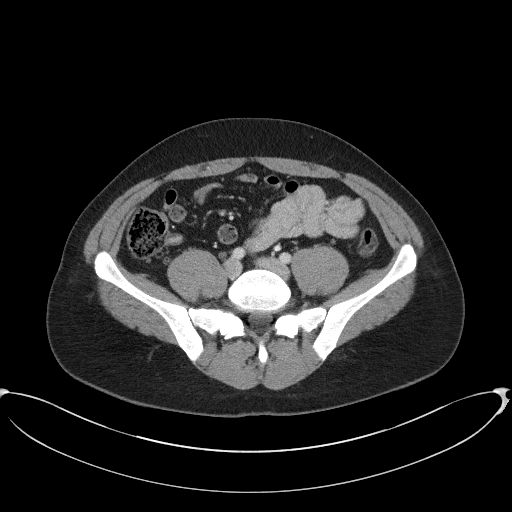
[im 69/119  soft-tissue]
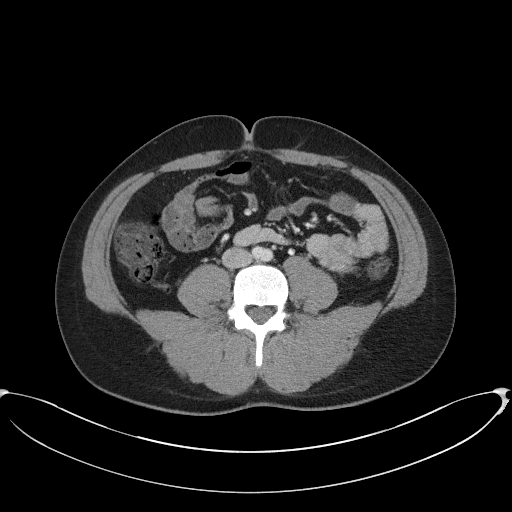
[im 79/119  soft-tissue]
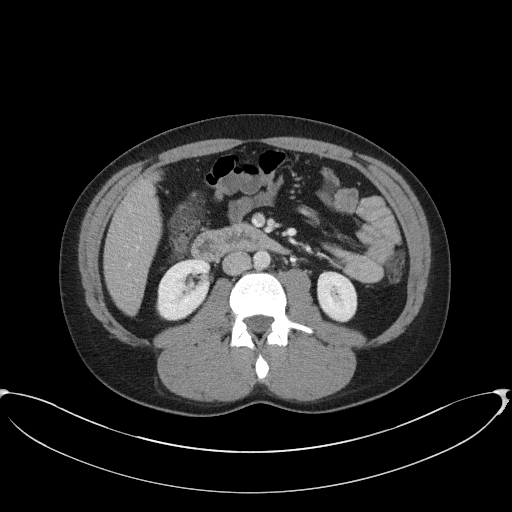
[im 79/119  bone]
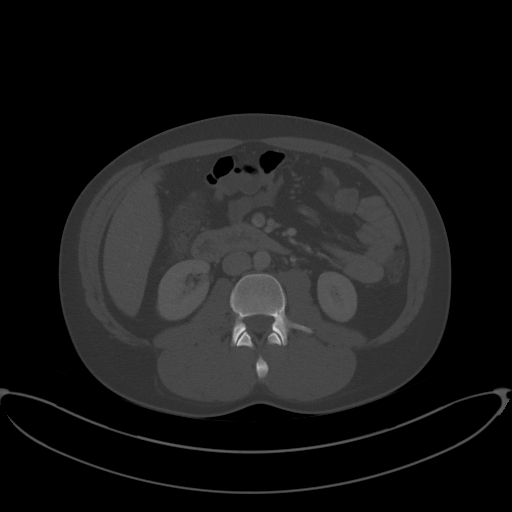
[im 84/119  soft-tissue]
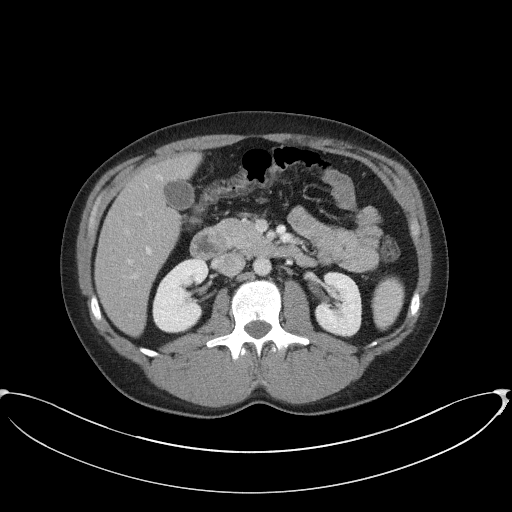
[im 94/119  soft-tissue]
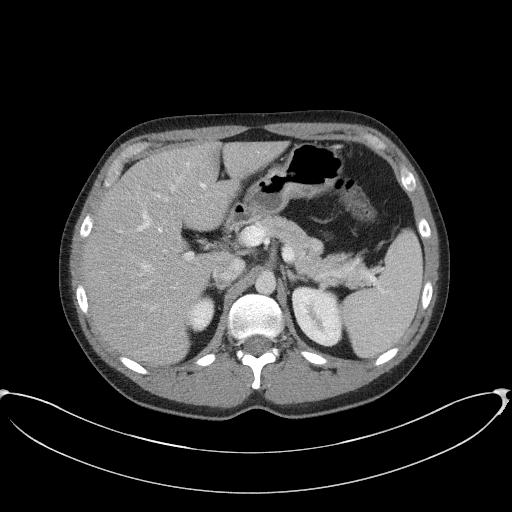
[im 104/119  soft-tissue]
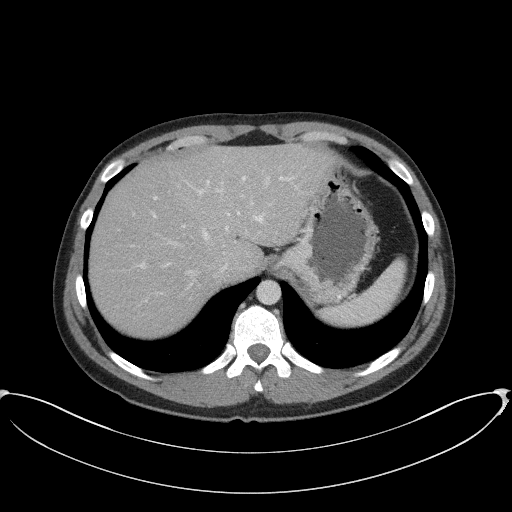
[im 114/119  soft-tissue]
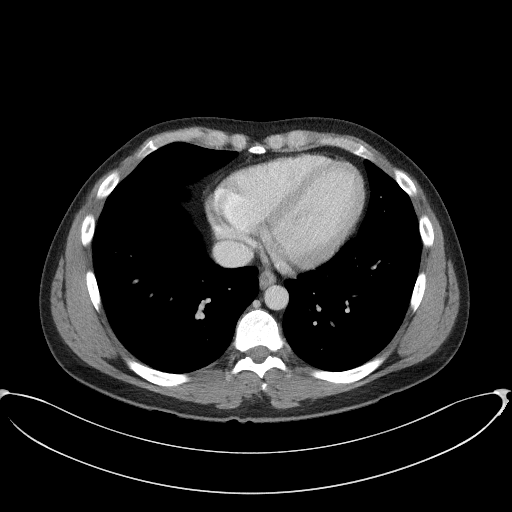

[Series 5: coronal st · coronal · 0.79mm/px · 3 of 89 slices shown]
[im 30/89  soft-tissue]
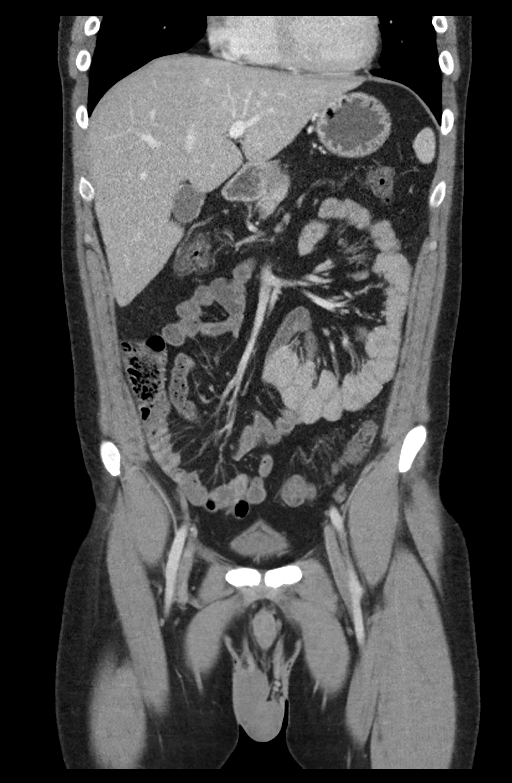
[im 40/89  soft-tissue]
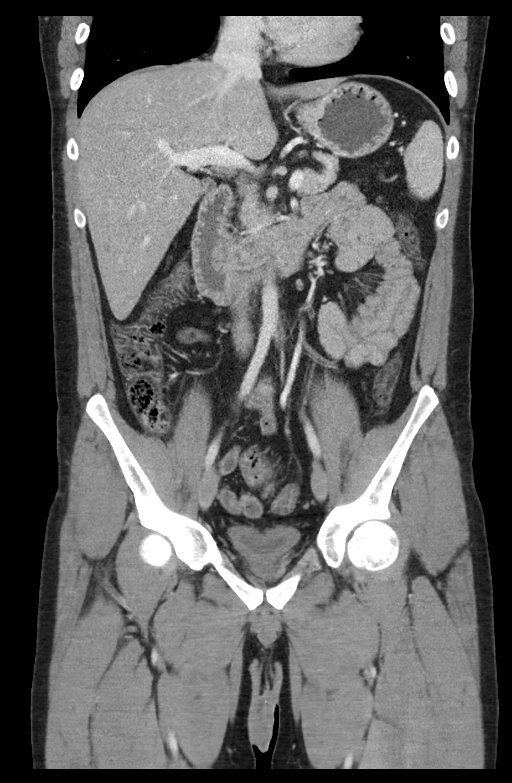
[im 49/89  soft-tissue]
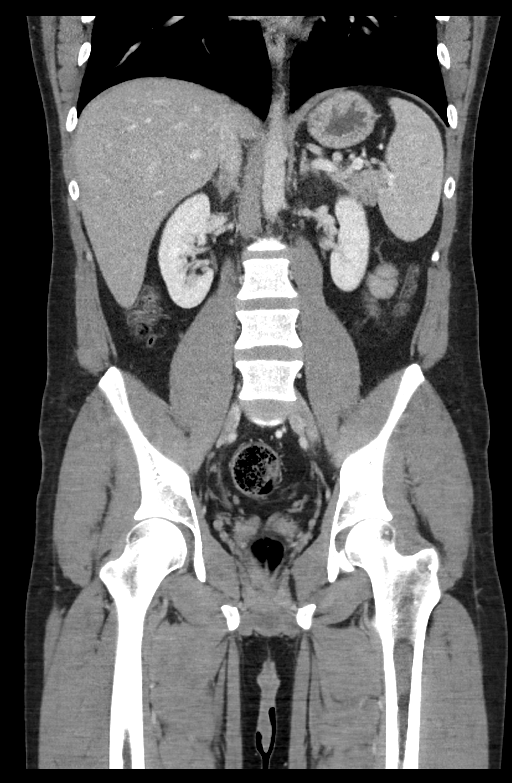

[16 of 46 positions shown; findings below may reference images not displayed]

FINDINGS: Lower chest: No acute abnormality.

Hepatobiliary: No focal liver abnormality is seen. No gallstones,
gallbladder wall thickening, or biliary dilatation.

Pancreas: Unremarkable.

Spleen: Unremarkable.

Adrenals/Urinary Tract: Adrenals, kidneys, and poorly distended
bladder are unremarkable.

Stomach/Bowel: Stomach is within normal limits. Bowel is normal in
caliber. Normal appendix. Mild diffuse colonic wall thickening.
Terminal ileum is poorly distended.

Vascular/Lymphatic: No significant vascular abnormality. No enlarged
lymph nodes.

Reproductive: Unremarkable.

Other: No ascites.  Abdominal wall is unremarkable.

Musculoskeletal: No acute osseous abnormality.
IMPRESSION: Normal appendix.

Mild diffuse colonic wall thickening, which may be on the basis of
underdistention. No surrounding inflammatory changes to suggest
acute process. Correlate with colonoscopy findings.

## 2022-12-30 IMAGING — DX DG KNEE 1-2V*R*
2 series · 2 of 2 positions shown · non-contrast
Comparison: None.

CLINICAL DATA: Pain

EXAM:
RIGHT KNEE - 1-2 VIEW

[knee ap]
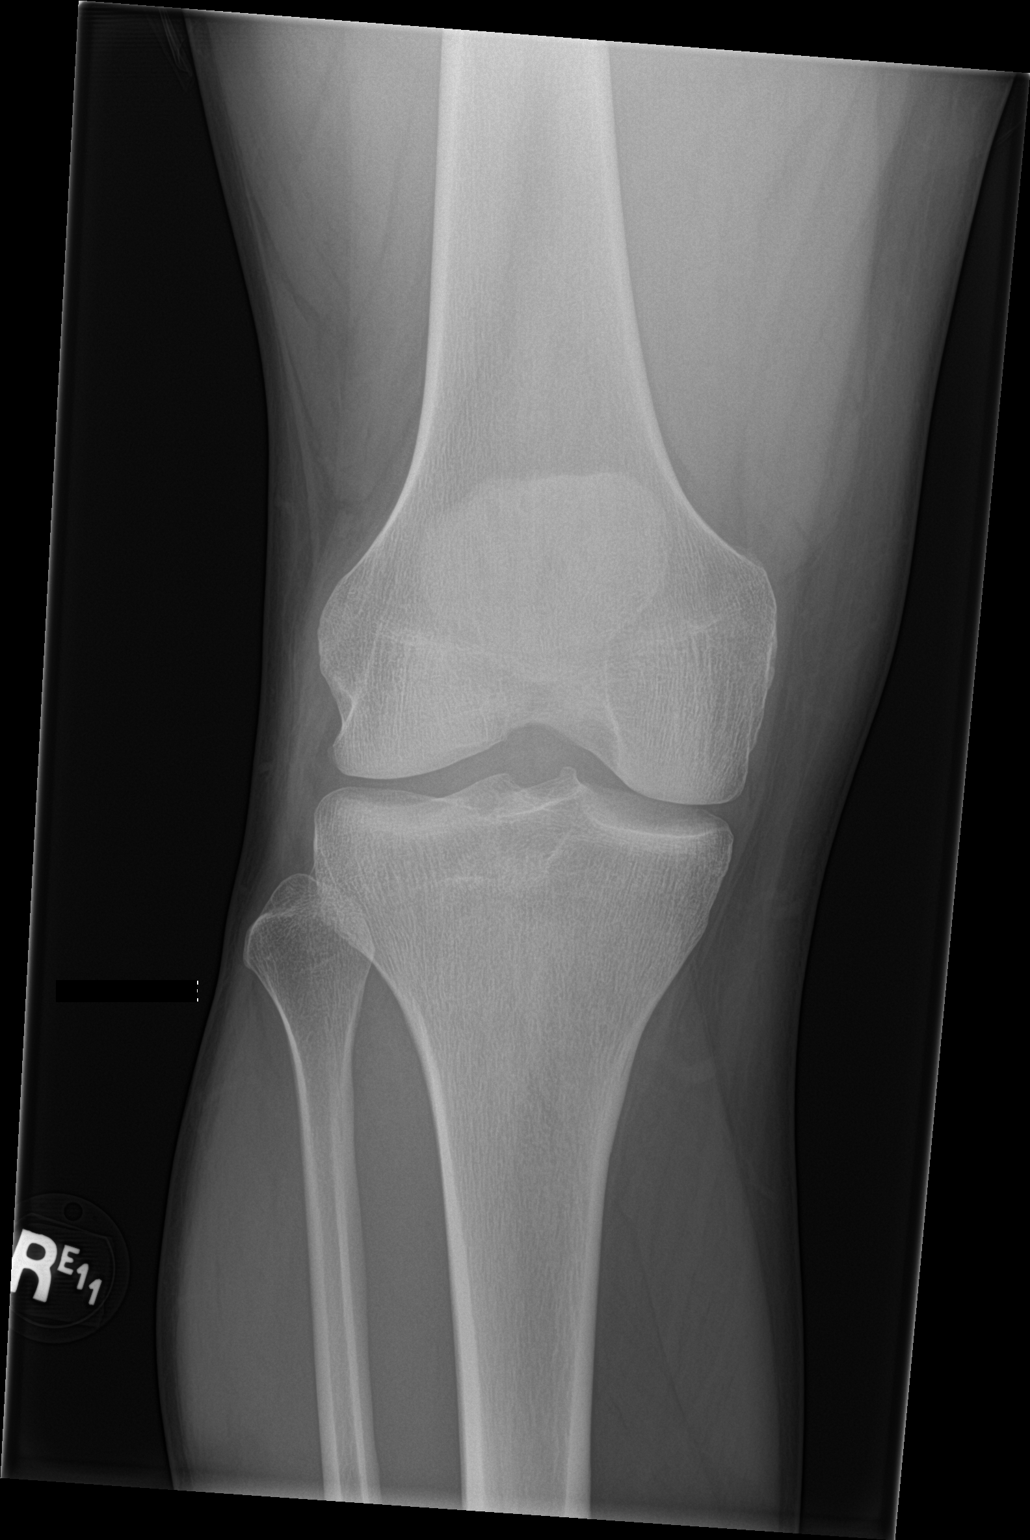

[knee lat]
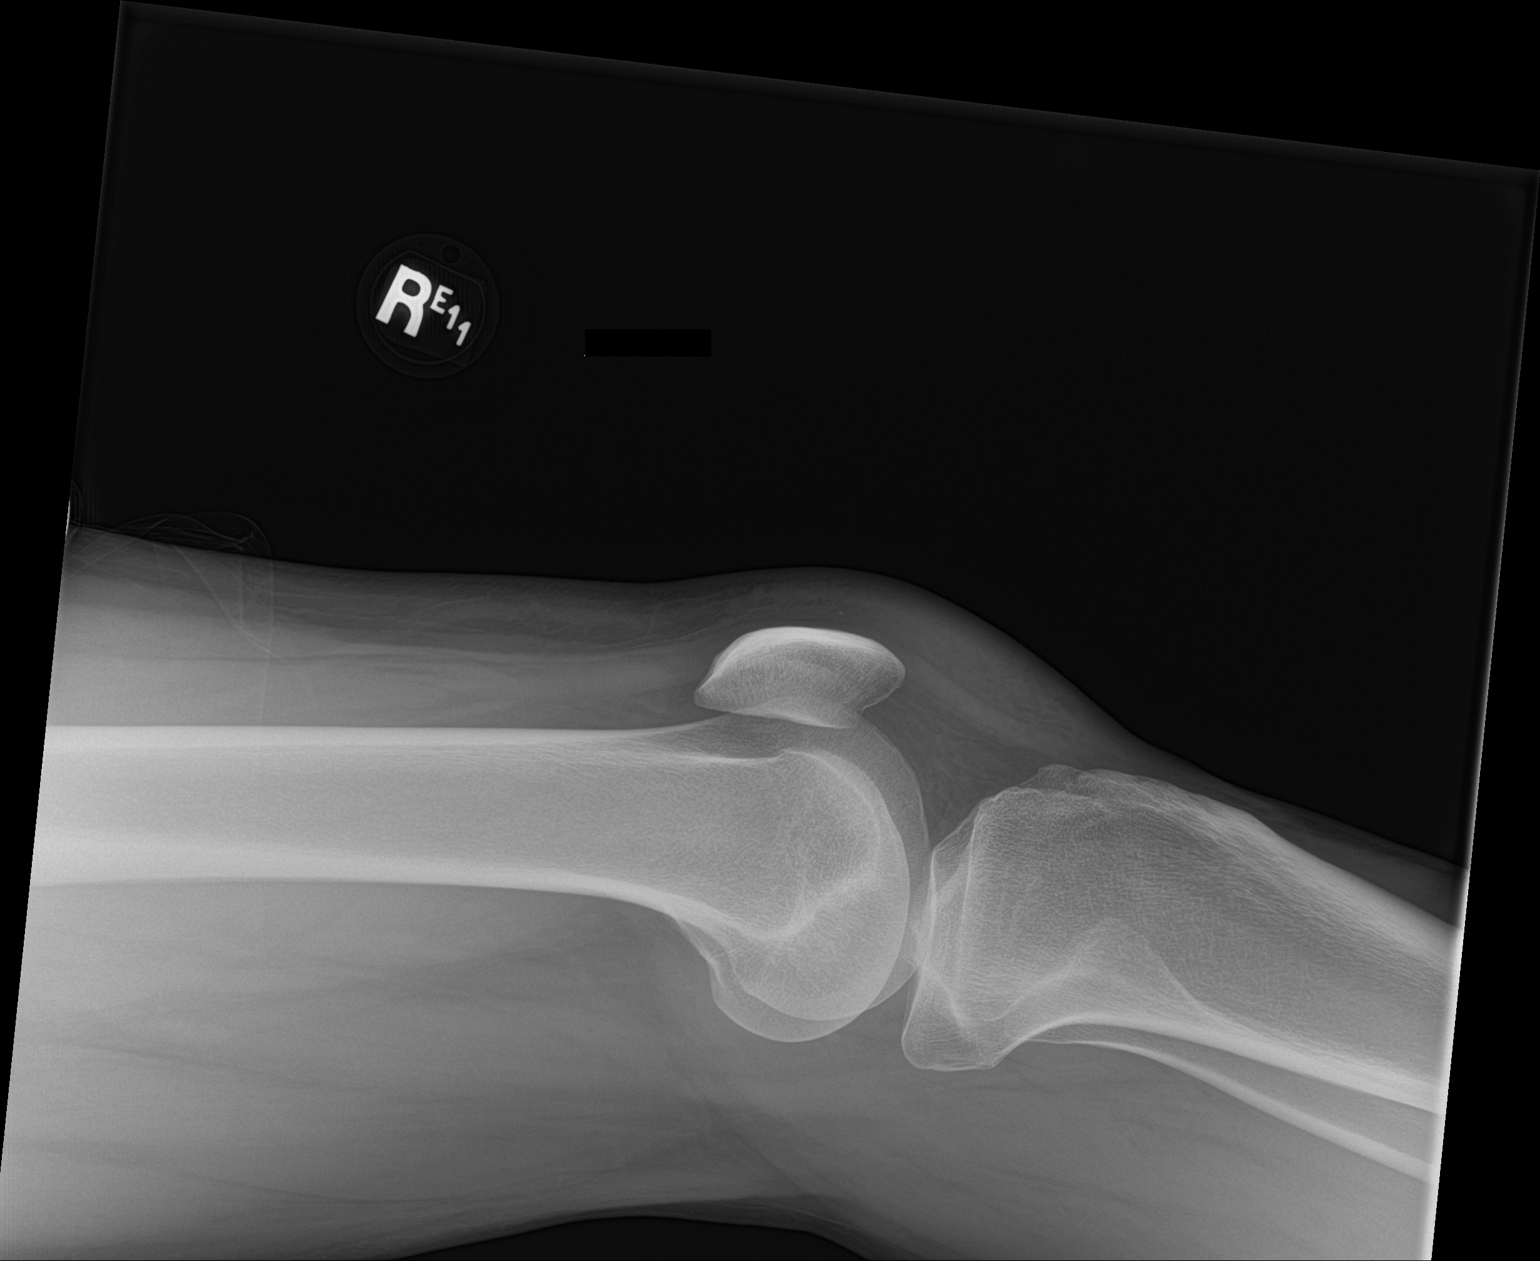

[2 of 2 positions shown; findings below may reference images not displayed]

FINDINGS: No evidence of acute fracture. No joint effusion. No significant
degenerative change. There is prepatellar soft tissue swelling.
IMPRESSION: Prepatellar soft tissue swelling, possibly bursitis.

No acute fracture or significant joint effusion.
